# Patient Record
Sex: Male | Born: 1935 | Race: White | Hispanic: No
Health system: Southern US, Community
[De-identification: ages and names within clinical notes are randomized; demographics above are authoritative.]

## PROBLEM LIST (undated history)

## (undated) DIAGNOSIS — F0280 Dementia in other diseases classified elsewhere without behavioral disturbance: Secondary | ICD-10-CM

## (undated) DIAGNOSIS — G309 Alzheimer's disease, unspecified: Secondary | ICD-10-CM

---

## 2006-02-01 ENCOUNTER — Other Ambulatory Visit: Payer: Self-pay

## 2006-02-01 ENCOUNTER — Ambulatory Visit: Payer: Self-pay | Admitting: Specialist

## 2006-02-24 ENCOUNTER — Ambulatory Visit: Payer: Self-pay | Admitting: Otolaryngology

## 2006-03-07 ENCOUNTER — Ambulatory Visit: Payer: Self-pay | Admitting: Radiation Oncology

## 2006-03-20 ENCOUNTER — Ambulatory Visit: Payer: Self-pay | Admitting: Radiation Oncology

## 2006-04-19 ENCOUNTER — Ambulatory Visit: Payer: Self-pay | Admitting: Radiation Oncology

## 2006-05-20 ENCOUNTER — Ambulatory Visit: Payer: Self-pay | Admitting: Radiation Oncology

## 2006-11-30 ENCOUNTER — Ambulatory Visit: Payer: Self-pay | Admitting: Radiation Oncology

## 2007-12-04 ENCOUNTER — Ambulatory Visit: Payer: Self-pay | Admitting: Radiation Oncology

## 2007-12-21 ENCOUNTER — Ambulatory Visit: Payer: Self-pay | Admitting: Radiation Oncology

## 2008-05-05 ENCOUNTER — Other Ambulatory Visit: Payer: Self-pay

## 2008-05-05 ENCOUNTER — Inpatient Hospital Stay: Payer: Self-pay | Admitting: Specialist

## 2008-06-05 ENCOUNTER — Ambulatory Visit: Payer: Self-pay | Admitting: Specialist

## 2013-03-02 ENCOUNTER — Emergency Department: Payer: Self-pay | Admitting: Emergency Medicine

## 2013-03-03 LAB — CBC
HCT: 43.9 % (ref 40.0–52.0)
MCH: 31 pg (ref 26.0–34.0)
MCV: 92 fL (ref 80–100)
RBC: 4.76 10*6/uL (ref 4.40–5.90)
WBC: 12.1 10*3/uL — ABNORMAL HIGH (ref 3.8–10.6)

## 2013-03-03 LAB — BASIC METABOLIC PANEL
Anion Gap: 3 — ABNORMAL LOW (ref 7–16)
BUN: 16 mg/dL (ref 7–18)
Calcium, Total: 8.4 mg/dL — ABNORMAL LOW (ref 8.5–10.1)
Chloride: 107 mmol/L (ref 98–107)
Creatinine: 1.14 mg/dL (ref 0.60–1.30)
EGFR (Non-African Amer.): 59 — ABNORMAL LOW
Osmolality: 283 (ref 275–301)
Potassium: 4 mmol/L (ref 3.5–5.1)
Sodium: 141 mmol/L (ref 136–145)

## 2013-03-03 LAB — PROTIME-INR: Prothrombin Time: 13.4 secs (ref 11.5–14.7)

## 2013-03-09 ENCOUNTER — Emergency Department: Payer: Self-pay | Admitting: Emergency Medicine

## 2013-03-09 LAB — URINALYSIS, COMPLETE
Bilirubin,UR: NEGATIVE
Glucose,UR: NEGATIVE mg/dL (ref 0–75)
Ketone: NEGATIVE
Leukocyte Esterase: NEGATIVE
Nitrite: NEGATIVE
Ph: 5 (ref 4.5–8.0)
Protein: 30
Specific Gravity: 1.025 (ref 1.003–1.030)
WBC UR: 4 /HPF (ref 0–5)

## 2013-03-12 ENCOUNTER — Emergency Department: Payer: Self-pay | Admitting: Emergency Medicine

## 2014-08-31 ENCOUNTER — Emergency Department: Payer: Self-pay | Admitting: Emergency Medicine

## 2015-01-10 ENCOUNTER — Emergency Department: Payer: Self-pay | Admitting: Internal Medicine

## 2015-06-15 ENCOUNTER — Emergency Department: Payer: Medicare PPO

## 2015-06-15 ENCOUNTER — Other Ambulatory Visit: Payer: Self-pay

## 2015-06-15 ENCOUNTER — Emergency Department
Admission: EM | Admit: 2015-06-15 | Discharge: 2015-06-15 | Disposition: A | Discharge status: Home / Self Care | Payer: Medicare PPO | Attending: Student | Admitting: Student

## 2015-06-15 ENCOUNTER — Encounter: Payer: Self-pay | Admitting: Emergency Medicine

## 2015-06-15 DIAGNOSIS — R4182 Altered mental status, unspecified: Secondary | ICD-10-CM | POA: Insufficient documentation

## 2015-06-15 DIAGNOSIS — R1110 Vomiting, unspecified: Secondary | ICD-10-CM | POA: Diagnosis present

## 2015-06-15 DIAGNOSIS — R1111 Vomiting without nausea: Secondary | ICD-10-CM

## 2015-06-15 HISTORY — DX: Dementia in other diseases classified elsewhere, unspecified severity, without behavioral disturbance, psychotic disturbance, mood disturbance, and anxiety: F02.80

## 2015-06-15 HISTORY — DX: Alzheimer's disease, unspecified: G30.9

## 2015-06-15 LAB — URINALYSIS COMPLETE WITH MICROSCOPIC (ARMC ONLY)
Bacteria, UA: NONE SEEN
Bilirubin Urine: NEGATIVE
GLUCOSE, UA: NEGATIVE mg/dL
Hgb urine dipstick: NEGATIVE
Leukocytes, UA: NEGATIVE
NITRITE: NEGATIVE
PROTEIN: 30 mg/dL — AB
Specific Gravity, Urine: 1.02 (ref 1.005–1.030)
pH: 6 (ref 5.0–8.0)

## 2015-06-15 LAB — BASIC METABOLIC PANEL
Anion gap: 10 (ref 5–15)
BUN: 17 mg/dL (ref 6–20)
CALCIUM: 8.7 mg/dL — AB (ref 8.9–10.3)
CHLORIDE: 106 mmol/L (ref 101–111)
CO2: 23 mmol/L (ref 22–32)
Creatinine, Ser: 1.2 mg/dL (ref 0.61–1.24)
GFR calc Af Amer: >60 mL/min (ref >60)
GFR calc non Af Amer: 53 mL/min — ABNORMAL LOW (ref >60)
GLUCOSE: 137 mg/dL — AB (ref 65–99)
Potassium: 4.6 mmol/L (ref 3.5–5.1)
Sodium: 139 mmol/L (ref 135–145)

## 2015-06-15 LAB — HEPATIC FUNCTION PANEL
ALK PHOS: 71 U/L (ref 38–126)
ALT: 19 U/L (ref 17–63)
AST: 35 U/L (ref 15–41)
Albumin: 3.3 g/dL — ABNORMAL LOW (ref 3.5–5.0)
BILIRUBIN INDIRECT: 0.6 mg/dL (ref 0.3–0.9)
Bilirubin, Direct: 0.2 mg/dL (ref 0.1–0.5)
TOTAL PROTEIN: 7 g/dL (ref 6.5–8.1)
Total Bilirubin: 0.8 mg/dL (ref 0.3–1.2)

## 2015-06-15 LAB — CBC WITH DIFFERENTIAL/PLATELET
BASOS ABS: 0.1 10*3/uL (ref 0–0.1)
Basophils Relative: 1 %
EOS PCT: 1 %
Eosinophils Absolute: 0.1 10*3/uL (ref 0–0.7)
HEMATOCRIT: 38 % — AB (ref 40.0–52.0)
Hemoglobin: 12.9 g/dL — ABNORMAL LOW (ref 13.0–18.0)
Lymphocytes Relative: 13 %
Lymphs Abs: 1.4 10*3/uL (ref 1.0–3.6)
MCH: 32.4 pg (ref 26.0–34.0)
MCHC: 33.9 g/dL (ref 32.0–36.0)
MCV: 95.7 fL (ref 80.0–100.0)
MONO ABS: 1 10*3/uL (ref 0.2–1.0)
MONOS PCT: 9 %
Neutro Abs: 8.3 10*3/uL — ABNORMAL HIGH (ref 1.4–6.5)
Neutrophils Relative %: 76 %
Platelets: 205 10*3/uL (ref 150–440)
RBC: 3.97 MIL/uL — ABNORMAL LOW (ref 4.40–5.90)
RDW: 13.4 % (ref 11.5–14.5)
WBC: 10.9 10*3/uL — ABNORMAL HIGH (ref 3.8–10.6)

## 2015-06-15 LAB — LIPASE, BLOOD: Lipase: 48 U/L (ref 22–51)

## 2015-06-15 LAB — TROPONIN I

## 2015-06-15 MED ORDER — SODIUM CHLORIDE 0.9 % IV BOLUS (SEPSIS)
500.0000 mL | Freq: Once | INTRAVENOUS | Status: AC
  Administered 2015-06-15: 500 mL via INTRAVENOUS

## 2015-06-15 NOTE — Discharge Instructions (Signed)
Thomas Mathis should return immediately to the emergency department if he develops recurrent vomiting, if there is blood in his vomit or stool, if his belly/abdomen is distended or if he is complaining of pain in the abdomen, chest, if he has a fever or for any  vital sign abnormality. Otherwise, he needs to be seen by [facility] in the morning.

## 2015-06-15 NOTE — ED Provider Notes (Signed)
[facility] Emergency Department Provider Note  ____________________________________________  Time seen: Approximately 12:12 PM  I have reviewed the triage vital signs and the nursing notes.   HISTORY  Chief Complaint Altered Mental Status and Emesis  Caveat-history of present illness and review of systems limited secondary to the patient's severe dementia. Review of systems obtained from staff at [facility] living facility.  HPI AREN PRYDE is a 81 y.o. male with history of  CVA, severe dementia presents for evaluation of one episode nbnb emesis. According to staff at his living facility, he awoke this morning in his usual state of health and ate breakfast. He then came downstairs sat at the piano and had one episode of  nonbloody nonbilious emesis. They checked his blood pressure and it was 88/69, heart rate 103. They report his usual blood pressure is "100 over something" and because of the drop in blood pressure and increase in heart rate they sent him to the emergency department for further evaluation. Staff reports that generally he is severely demented, combative at baseline. He has had no fevers, no prior vomiting, no diarrhea, no recent falls.   No past medical history on file.  There are no active problems to display for this patient.   No past surgical history on file.  No current outpatient prescriptions on file.  Allergies Review of patient's allergies indicates not on file.  No family history on file.  Social History History  Substance Use Topics  . Smoking status: Not on file  . Smokeless tobacco: Not on file  . Alcohol Use: Not on file    Review of Systems Constitutional: No fever/chills Respiratory: Denies shortness of breath. Gastrointestinal: + vomiting.  No diarrhea.     Caveat-history of present illness and review of systems limited secondary to the patient's severe dementia. Review of systems obtained from staff at  [facility] facility. ____________________________________________   PHYSICAL EXAM:  VITAL SIGNS: ED Triage Vitals  Enc Vitals Group     BP 06/15/15 1113 104/68 mmHg     Pulse Rate 06/15/15 1113 98     Resp 06/15/15 1113 22     Temp 06/15/15 1113 97.7 F (36.5 C)     Temp Source 06/15/15 1113 Oral     SpO2 06/15/15 1113 97 %     Weight 06/15/15 1113 180 lb (81.647 kg)     Height 06/15/15 1113  (1.803 m)     Head Cir --      Peak Flow --      Pain Score --      Pain Loc --      Pain Edu? --      Excl. in GC? --     Constitutional: sleeping but awakens to voice and light touch at which point he becomes somewhat agitated, combative but is redirectable and for the most part follows instructions.  He is intermittently oriented to self, he is oriented to place, he is not oriented to year. Eyes: Conjunctivae are normal. PERRL. EOMI. Head: Atraumatic. Nose: No congestion/rhinnorhea. Mouth/Throat: Mucous membranes are moist.  Oropharynx non-erythematous. Neck: No stridor.   Cardiovascular: Normal rate, regular rhythm. Grossly normal heart sounds.  Good peripheral circulation. Respiratory: Normal respiratory effort.  No retractions. Lungs CTAB. Gastrointestinal: Soft and nontender. No distention. No abdominal bruits. No CVA tenderness. Genitourinary: deferred Musculoskeletal: No lower extremity tenderness nor edema.  No joint effusions. Neurologic:  Normal speech and language. No gross focal neurologic deficits are appreciated. Speech is  normal.  Skin:  Skin is warm, dry and intact. No rash noted. Psychiatric: Mood and affect are normal. Speech and behavior are normal.  ____________________________________________   LABS (all labs ordered are listed, but only abnormal results are displayed)  Labs Reviewed  BASIC METABOLIC PANEL - Abnormal; Notable for the following:    Glucose, Bld 137 (*)    Calcium 8.7 (*)    GFR calc non Af Amer 53 (*)    All other components  within normal limits  CBC WITH DIFFERENTIAL/PLATELET - Abnormal; Notable for the following:    WBC 10.9 (*)    RBC 3.97 (*)    Hemoglobin 12.9 (*)    HCT 38.0 (*)    Neutro Abs 8.3 (*)    All other components within normal limits  URINALYSIS COMPLETEWITH MICROSCOPIC ([facility] ONLY) - Abnormal; Notable for the following:    Color, Urine YELLOW (*)    APPearance CLEAR (*)    Ketones, ur TRACE (*)    Protein, ur 30 (*)    Squamous Epithelial / LPF 0-5 (*)    All other components within normal limits  HEPATIC FUNCTION PANEL - Abnormal; Notable for the following:    Albumin 3.3 (*)    All other components within normal limits  TROPONIN I  LIPASE, BLOOD  CBG MONITORING, ED   ____________________________________________  EKG  ED ECG REPORT I, Gayla Doss, the attending physician, personally viewed and interpreted this ECG.   Date: 06/15/2015  EKG Time: 11:37  Rate: 87  Rhythm: normal sinus rhythm  Axis: left axis  Intervals:right bundle branch block, incomplete  ST&T Change: No acute ST segment elevation  ____________________________________________  RADIOLOGY  CT head IMPRESSION: 1. Suspected old infarcts inferiorly in the frontal lobes and in the left anterior watershed distribution. Extensive chronic ischemic microvascular white matter disease. 2. Increasing (from 01/10/2015) confluent abnormal white matter hypodensity in the right parietal corona radiata, possibly from confluent chronic microvascular white matter disease although a white matter stroke or lesion cannot be completely excluded. Brain MRI could be utilized for further investigation. 3. There is some scattered densities along the right inferior frontal lobe infarct. These appear chronic and may represent dystrophic calcification. 4. Chronic right frontal and right ethmoid sinusitis.  3 view abdominal plain films FINDINGS: There is no evidence of dilated bowel loops or free intraperitoneal air. No  radiopaque calculi or other significant radiographic abnormality is seen. Heart size and mediastinal contours are within normal limits. Both lungs are clear. Degenerative changes in the spine and hips.  IMPRESSION: Negative abdominal radiographs. No acute cardiopulmonary disease.   ____________________________________________   PROCEDURES  Procedure(s) performed: None  Critical Care performed: No  ____________________________________________   INITIAL IMPRESSION / ASSESSMENT AND PLAN / ED COURSE  Pertinent labs & imaging results that were available during my care of the patient were reviewed by me and considered in my medical decision making (see chart for details).  JEFRY LESINSKI is a 81 y.o. male with history of  CVA, severe dementia presents for evaluation of one episode nbnb emesis.on exam, he does not appear to be in any acute distress. Vital signs stable, he is afebrile. He moves all extremities equally. Abdomen is soft, nontender nondistended. No recurrent emesis since arrival to the emergency department. CT head notable for old infarct as well as white matter changes increasing from 01/10/2015. He has documented history of stroke, is at his baseline in terms of mental status, moves all extremities equally, does not require  inpatient admission and I discussed this with staff at his facility and after his making house calls will see him in the morning. Plain films of the abdomen are negative for obstruction or perforation. Labs are generally unremarkable with the exception of  mild anemia. will give small bolus of IV fluids and dc home.  ----------------------------------------- 2:39 PM on 06/15/2015 -----------------------------------------  Blood pressure 118/76 at this time. Patient continues to rest comfortably.no vomiting. DC home. ____________________________________________   FINAL CLINICAL IMPRESSION(S) / ED DIAGNOSES  Final diagnoses:  Non-intractable vomiting  without nausea, vomiting of unspecified type      Gayla Doss, MD 06/15/15 2102

## 2015-06-15 NOTE — ED Notes (Signed)
Pt arrived via EMS from [facility]. Per EMS pt is normally combative but now "acting strangely". Pt vomited once at homecare. Pt alert and oriented to person.

## 2016-04-04 ENCOUNTER — Emergency Department
Admission: EM | Admit: 2016-04-04 | Discharge: 2016-04-04 | Disposition: A | Discharge status: Skilled Nursing Facility | Payer: Medicare Other | Attending: Emergency Medicine | Admitting: Emergency Medicine

## 2016-04-04 ENCOUNTER — Emergency Department: Payer: Medicare Other

## 2016-04-04 DIAGNOSIS — R079 Chest pain, unspecified: Secondary | ICD-10-CM

## 2016-04-04 DIAGNOSIS — J209 Acute bronchitis, unspecified: Secondary | ICD-10-CM | POA: Diagnosis present

## 2016-04-04 DIAGNOSIS — G308 Other Alzheimer's disease: Secondary | ICD-10-CM | POA: Diagnosis not present

## 2016-04-04 DIAGNOSIS — F0280 Dementia in other diseases classified elsewhere without behavioral disturbance: Secondary | ICD-10-CM | POA: Diagnosis not present

## 2016-04-04 DIAGNOSIS — F0390 Unspecified dementia without behavioral disturbance: Secondary | ICD-10-CM

## 2016-04-04 DIAGNOSIS — R0602 Shortness of breath: Secondary | ICD-10-CM | POA: Diagnosis present

## 2016-04-04 LAB — BASIC METABOLIC PANEL
Anion gap: 6 (ref 5–15)
BUN: 19 mg/dL (ref 6–20)
CO2: 29 mmol/L (ref 22–32)
Calcium: 8.7 mg/dL — ABNORMAL LOW (ref 8.9–10.3)
Chloride: 104 mmol/L (ref 101–111)
Creatinine, Ser: 1.16 mg/dL (ref 0.61–1.24)
GFR calc Af Amer: >60 mL/min (ref >60)
GFR calc non Af Amer: 55 mL/min — ABNORMAL LOW (ref >60)
GLUCOSE: 135 mg/dL — AB (ref 65–99)
POTASSIUM: 3.9 mmol/L (ref 3.5–5.1)
Sodium: 139 mmol/L (ref 135–145)

## 2016-04-04 LAB — TROPONIN I: Troponin I: <0.03 ng/mL (ref <0.031)

## 2016-04-04 LAB — CBC
HEMATOCRIT: 39.8 % — AB (ref 40.0–52.0)
HEMOGLOBIN: 13.7 g/dL (ref 13.0–18.0)
MCH: 33.1 pg (ref 26.0–34.0)
MCHC: 34.5 g/dL (ref 32.0–36.0)
MCV: 95.8 fL (ref 80.0–100.0)
Platelets: 186 10*3/uL (ref 150–440)
RBC: 4.15 MIL/uL — ABNORMAL LOW (ref 4.40–5.90)
RDW: 13.4 % (ref 11.5–14.5)
WBC: 7.9 10*3/uL (ref 3.8–10.6)

## 2016-04-04 MED ORDER — CEPHALEXIN 500 MG PO CAPS: 500.0000 mg | ORAL_CAPSULE | Freq: Four times a day (QID) | ORAL | Status: AC

## 2016-04-04 MED ORDER — CEPHALEXIN 500 MG PO CAPS: 500.0000 mg | ORAL_CAPSULE | Freq: Once | ORAL | Status: DC

## 2016-04-04 MED ORDER — ALBUTEROL SULFATE HFA 108 (90 BASE) MCG/ACT IN AERS: 2.0000 | INHALATION_SPRAY | Freq: Four times a day (QID) | RESPIRATORY_TRACT | Status: AC | PRN

## 2016-04-04 NOTE — ED Notes (Signed)
Patient arrived by EMS from group home. Relayed to RN that patient is a ward of the state and has no family to contact. EMS reports that facility states patient was having chest pain and coughing up pink mucous. Patient is unable to answer any questions due to dementia.

## 2016-04-04 NOTE — ED Notes (Signed)
Patient transported to X-ray 

## 2016-04-04 NOTE — ED Provider Notes (Signed)
[facility] Emergency Department Provider Note ___________________________________________  Time seen: Approximately 9:39 AM  I have reviewed the triage vital signs and the nursing notes.   HISTORY  Chief Complaint Chest Pain and Cough  HPI Thomas Mathis is a 82 y.o. male who has history of dementia and was sent over by the nursing care facility for chest pain and cough. On arrival to the ER if he would ask patient if he was having chest pain at times she would say yes and at times even say no. Patient unable to give any other significant history secondary to the severity of his dementia. There was no other known recent illnesses or occurrences per the nursing care facility.   Past Medical History  Diagnosis Date  . Alzheimer's dementia     Patient Active Problem List   Diagnosis Date Noted  . Shortness of breath 04/04/2016  . Acute bronchitis 04/04/2016    No past surgical history on file.  Current Outpatient Rx  Name  Route  Sig  Dispense  Refill  . albuterol (PROVENTIL HFA;VENTOLIN HFA) 108 (90 Base) MCG/ACT inhaler   Inhalation   Inhale 2 puffs into the lungs every 6 (six) hours as needed for wheezing or shortness of breath.   1 Inhaler   2   . cephALEXin (KEFLEX) 500 MG capsule   Oral   Take 1 capsule (500 mg total) by mouth 4 (four) times daily.   40 capsule   0     Allergies Review of patient's allergies indicates not on file.  No family history on file.  Social History Social History  Substance Use Topics  . Smoking status: Unknown If Ever Smoked  . Smokeless tobacco: Not on file  . Alcohol Use: No    Review of Systems Patient unable to give any review of systems secondary to dementia 10-point ROS   ____________________________________________   PHYSICAL EXAM:  VITAL SIGNS: ED Triage Vitals  Enc Vitals Group     BP --      Pulse --      Resp --      Temp --      Temp src --      SpO2 --      Weight --    Height --      Head Cir --      Peak Flow --      Pain Score --      Pain Loc --      Pain Edu? --      Excl. in GC? --     Constitutional: Alert, But demented. Well appearing and in no acute distress. Eyes: Conjunctivae are normal. PERRL. EOMI. Head: Atraumatic. Nose: No congestion/rhinnorhea. Mouth/Throat: Mucous membranes are moist.  Oropharynx non-erythematous. Neck: No stridor.   Cardiovascular: Normal rate, regular rhythm. Grossly normal heart sounds.  Good peripheral circulation. Respiratory: Normal respiratory effort.  No retractions. Lungs CTAB. Gastrointestinal: Soft and nontender. No distention. No abdominal bruits. No CVA tenderness. Musculoskeletal: No lower extremity tenderness nor edema.  No joint effusions. Neurologic:  Obviously with dementia, but tries to answer questions. No gross focal neurologic deficits are appreciated. No gait instability. Skin:  Skin is warm, dry and intact. No rash noted. Psychiatric: Patient is confused and with very aggressive behavior, and hits at the staff..  ____________________________________________   LABS (all labs ordered are listed, but only abnormal results are displayed)  Labs Reviewed  BASIC METABOLIC PANEL - Abnormal; Notable for the following:  Glucose, Bld 135 (*)    Calcium 8.7 (*)    GFR calc non Af Amer 55 (*)    All other components within normal limits  CBC - Abnormal; Notable for the following:    RBC 4.15 (*)    HCT 39.8 (*)    All other components within normal limits  TROPONIN I  TROPONIN I   ____________________________________________  EKG  ED ECG REPORT I, Leona Carry, the attending physician, personally viewed and interpreted this ECG.   Date: 04/04/2016  EKG Time: 1054  Rate: 91  Rhythm: normal sinus rhythm  Axis: Left axis deviation  Intervals:left bundle branch block  ST&T Change: Nonspecific ST and T-wave changes  ____________________________________________  RADIOLOGY  Dg  Chest 2 View  04/04/2016  CLINICAL DATA:  Cough.  Chest pain. EXAM: CHEST  2 VIEW COMPARISON:  06/15/2015. FINDINGS: The cardiac silhouette remains borderline enlarged. A prominent left ventricular contour is again demonstrated. Tortuous aorta. Clear lungs. Diffuse osteopenia. Thoracic spine degenerative changes. IMPRESSION: No acute abnormality. Stable borderline cardiomegaly with probable left ventricular hypertrophy. Electronically Signed   By: Beckie Salts M.D.   On: 04/04/2016 10:31    ____________________________________________   PROCEDURES  Procedure(s) performed: None  Critical Care performed: No  ____________________________________________   INITIAL IMPRESSION / ASSESSMENT AND PLAN / ED COURSE  Pertinent labs & imaging results that were available during my care of the patient were reviewed by me and considered in my medical decision making (see chart for details).  1:20 PM Patient will get routine cardiac enzymes, EKG, and chest x-ray at this time.  1:20 PM Patient's initial cardiac workup was negative. Patient will get a repeat troponin at 3 hour mark to be able to send him home and have her follow-up for workup as an outpatient.  1:20 PM Patient's repeat troponin was negative. Patient never had any complaints in the emergency department. Patient is going to be sent back to the care facility to follow up with cardiology as an outpatient this week. Instructions were given to return patient immediately if condition worsens. ____________________________________________   FINAL CLINICAL IMPRESSION(S) / ED DIAGNOSES  Final diagnoses:  Chest pain, unspecified chest pain type  Dementia, without behavioral disturbance      Leona Carry, MD 04/04/16 1320

## 2016-04-21 ENCOUNTER — Encounter
Admission: RE | Admit: 2016-04-21 | Discharge: 2016-04-21 | Disposition: A | Discharge status: Home / Self Care | Payer: Medicare Other | Source: Ambulatory Visit | Attending: Internal Medicine | Admitting: Internal Medicine

## 2016-05-20 DEATH — deceased

## 2017-01-18 IMAGING — CT CT HEAD W/O CM
1 of 2 series · 13 of 30 positions shown, 17 images · non-contrast
Comparison: 01/10/2015

CLINICAL DATA: Altered mental status.  Combative.  Emesis.

EXAM:
CT HEAD WITHOUT CONTRAST
TECHNIQUE: Contiguous axial images were obtained from the base of the skull
through the vertex without intravenous contrast.

[Series 4: soft tissue recon · axial · 0.42mm/px · z∈[-152,-28]mm · 13 of 31 slices shown, 17 images]
[im 3/31  brain]
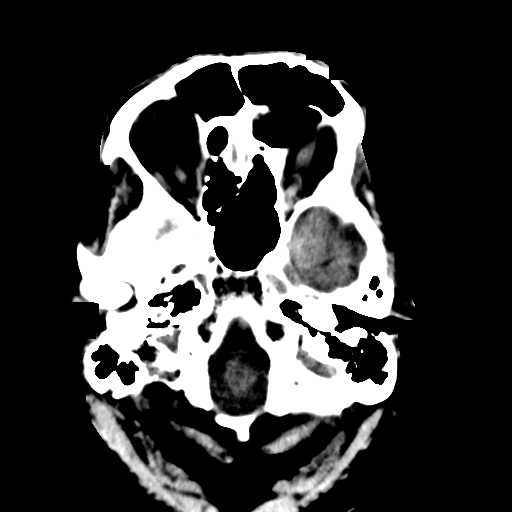
[im 3/31  bone]
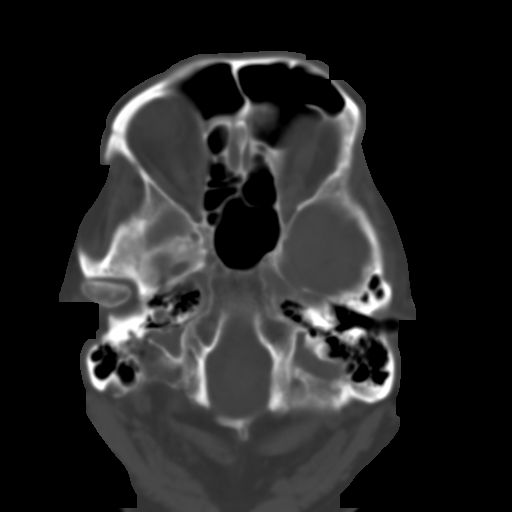
[im 5/31  brain]
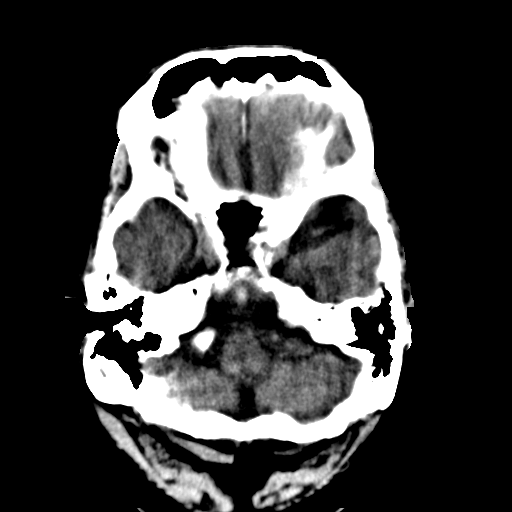
[im 7/31  brain]
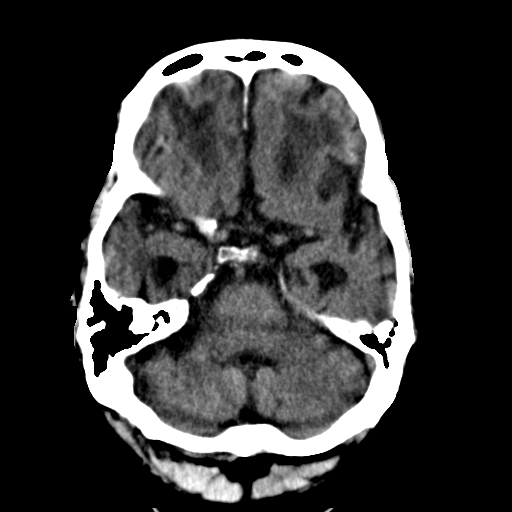
[im 9/31  brain]
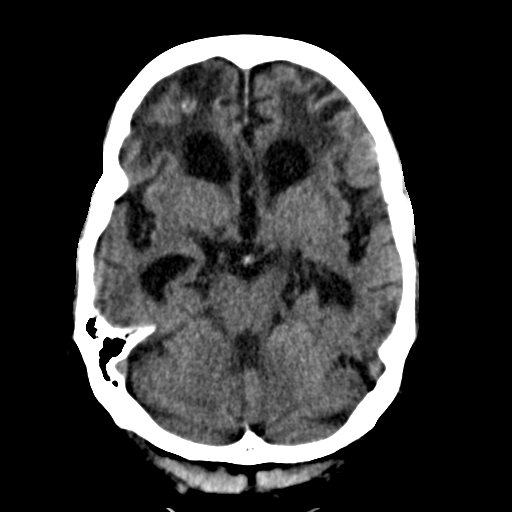
[im 11/31  brain]
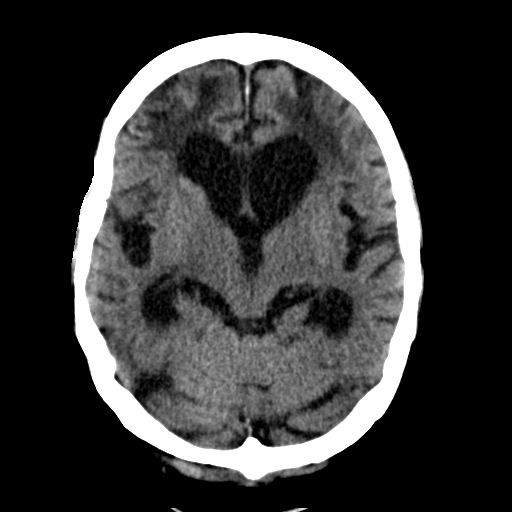
[im 11/31  bone]
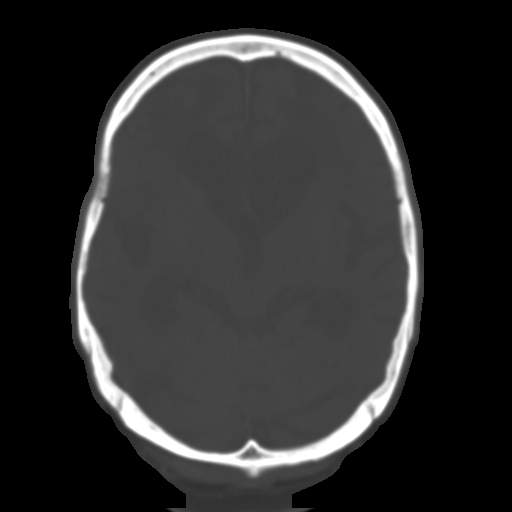
[im 13/31  brain]
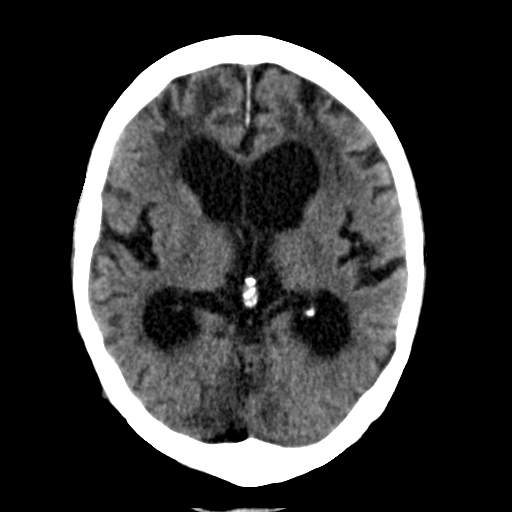
[im 16/31  brain]
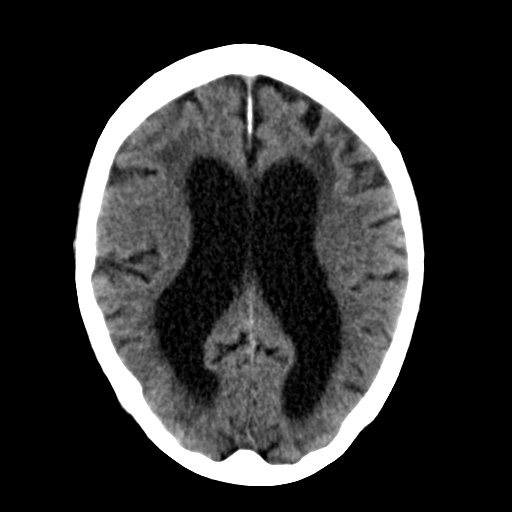
[im 18/31  brain]
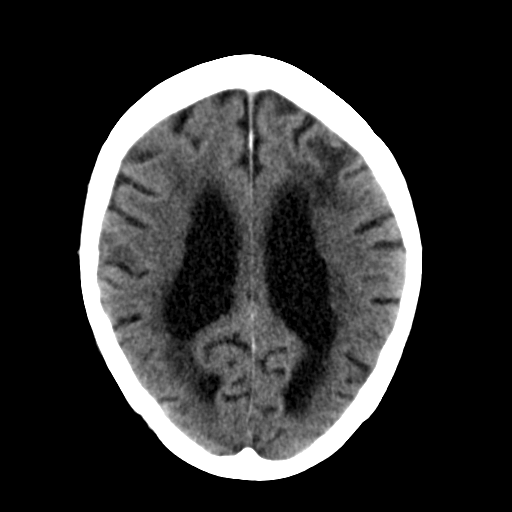
[im 20/31  brain]
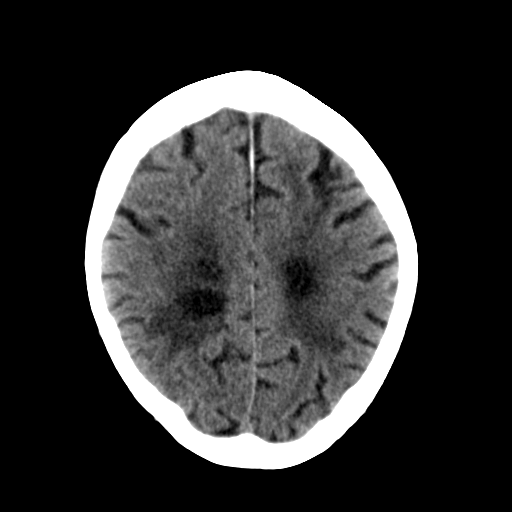
[im 20/31  bone]
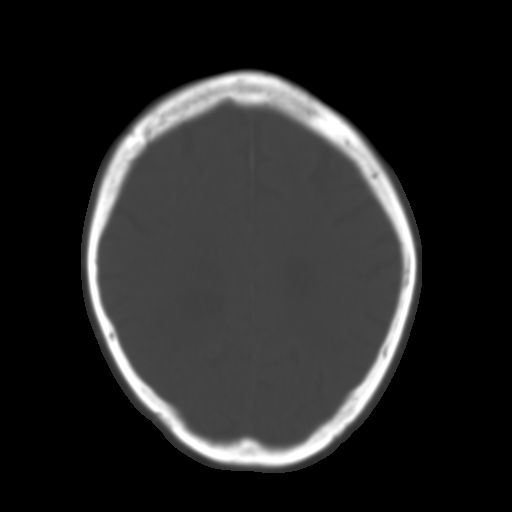
[im 22/31  brain]
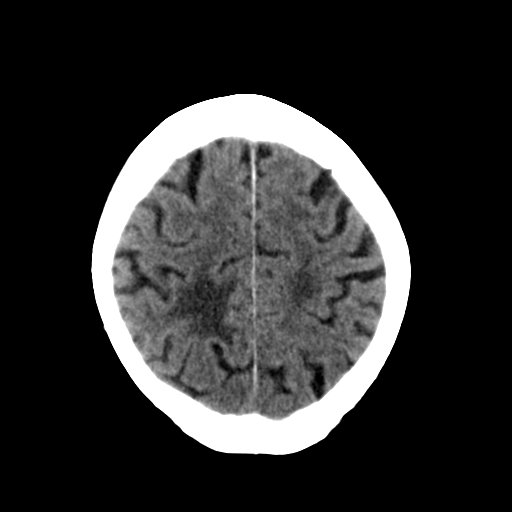
[im 24/31  brain]
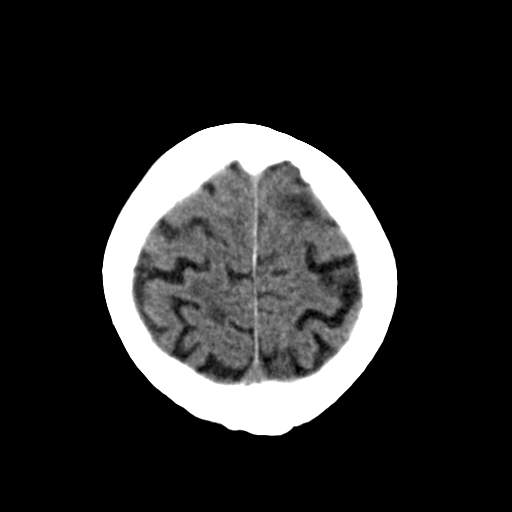
[im 26/31  brain]
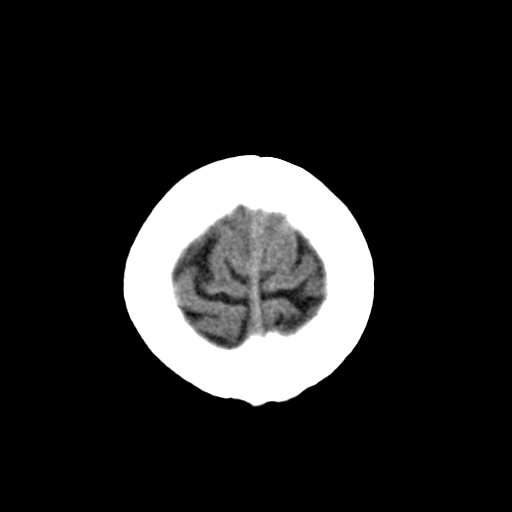
[im 28/31  brain]
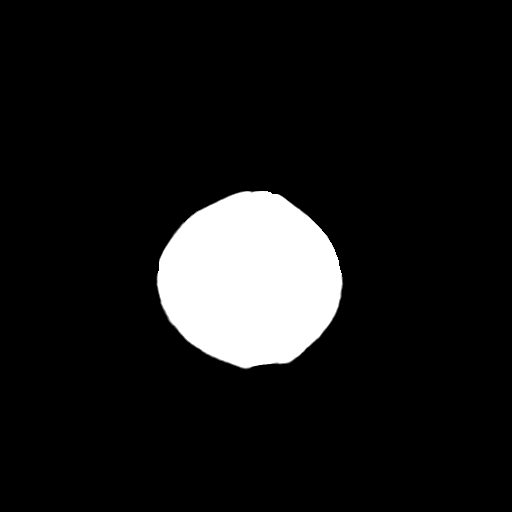
[im 28/31  bone]
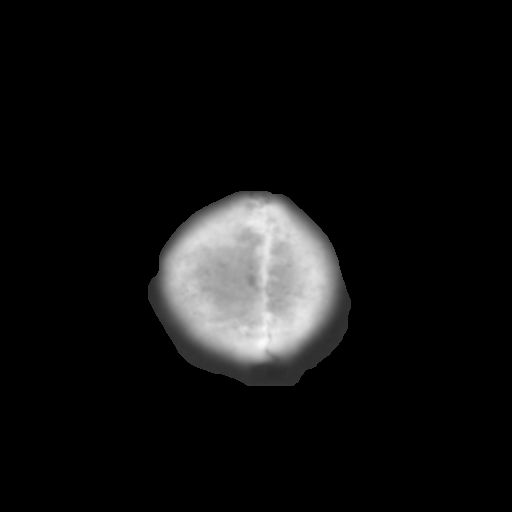

[13 of 30 positions shown; findings below may reference images not displayed]

FINDINGS: Extensive white matter hypodensity bilaterally favoring chronic
ischemic microvascular white matter disease. Suspected remote
infarcts in the inferior frontal lobes as well as the left anterior
watershed region. Indistinct density inferiorly in the right frontal
lobe may represent dystrophic calcification along the
infarct/encephalomalacia, with a mass in this vicinity considered
less likely.

There is a progressive degree of abnormal hypodensity confluent in
the right parietal corona radiata, increased from 01/10/2015.

No acute intracranial hemorrhage.  No mass effect.

Mild chronic right frontal and right ethmoid sinusitis.
IMPRESSION: 1. Suspected old infarcts inferiorly in the frontal lobes and in the
left anterior watershed distribution. Extensive chronic ischemic
microvascular white matter disease.
2. Increasing (from 01/10/2015) confluent abnormal white matter
hypodensity in the right parietal corona radiata, possibly from
confluent chronic microvascular white matter disease although a
white matter stroke or lesion cannot be completely excluded. Brain
MRI could be utilized for further investigation.
3. There is some scattered densities along the right inferior
frontal lobe infarct. These appear chronic and may represent
dystrophic calcification.
4. Chronic right frontal and right ethmoid sinusitis.

## 2017-11-08 IMAGING — CR DG CHEST 2V
1 series · 2 of 2 positions shown · non-contrast
Comparison: 06/15/2015.

CLINICAL DATA: Cough.  Chest pain.

EXAM:
CHEST  2 VIEW

[Series 1: dg chest 2 view · 0.14mm/px · 2 of 2 slices shown]
[im 1/2]
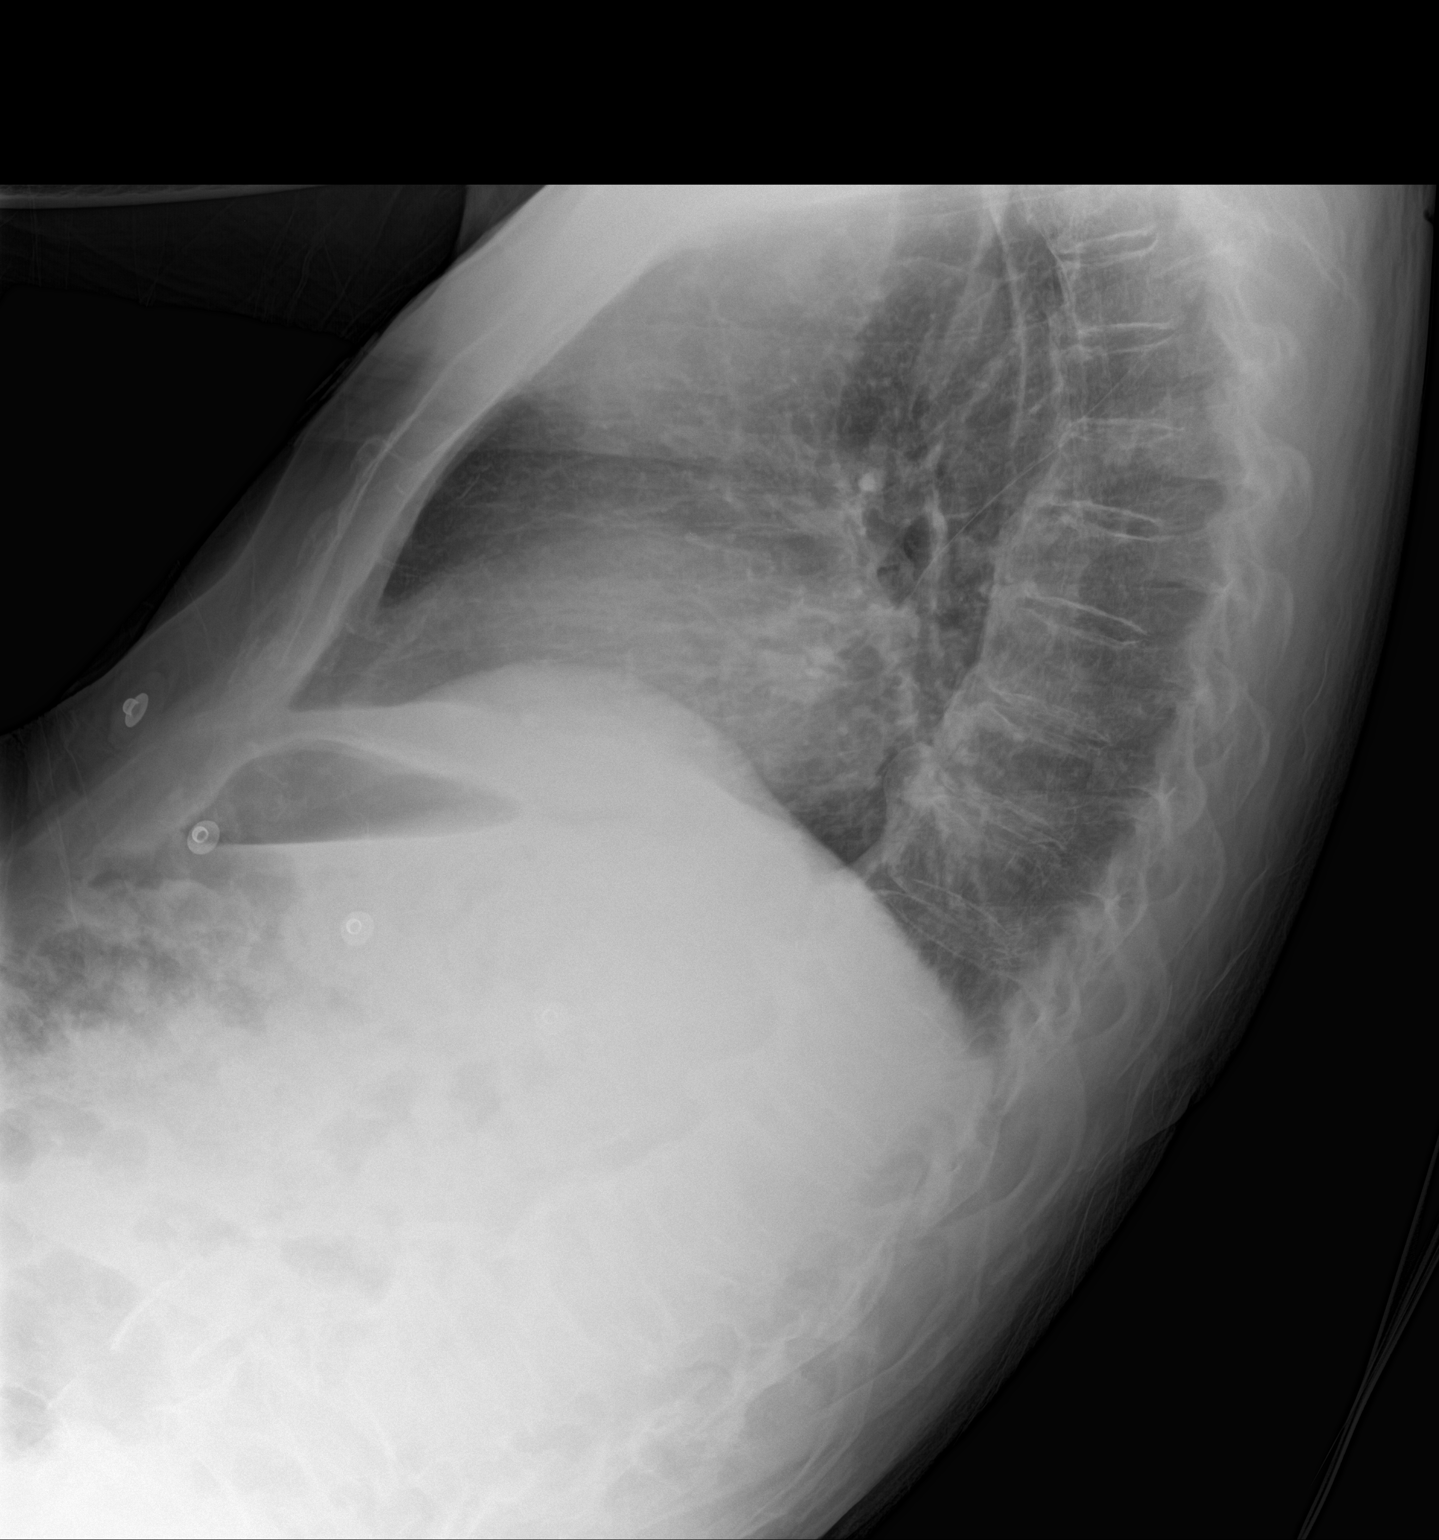
[im 2/2]
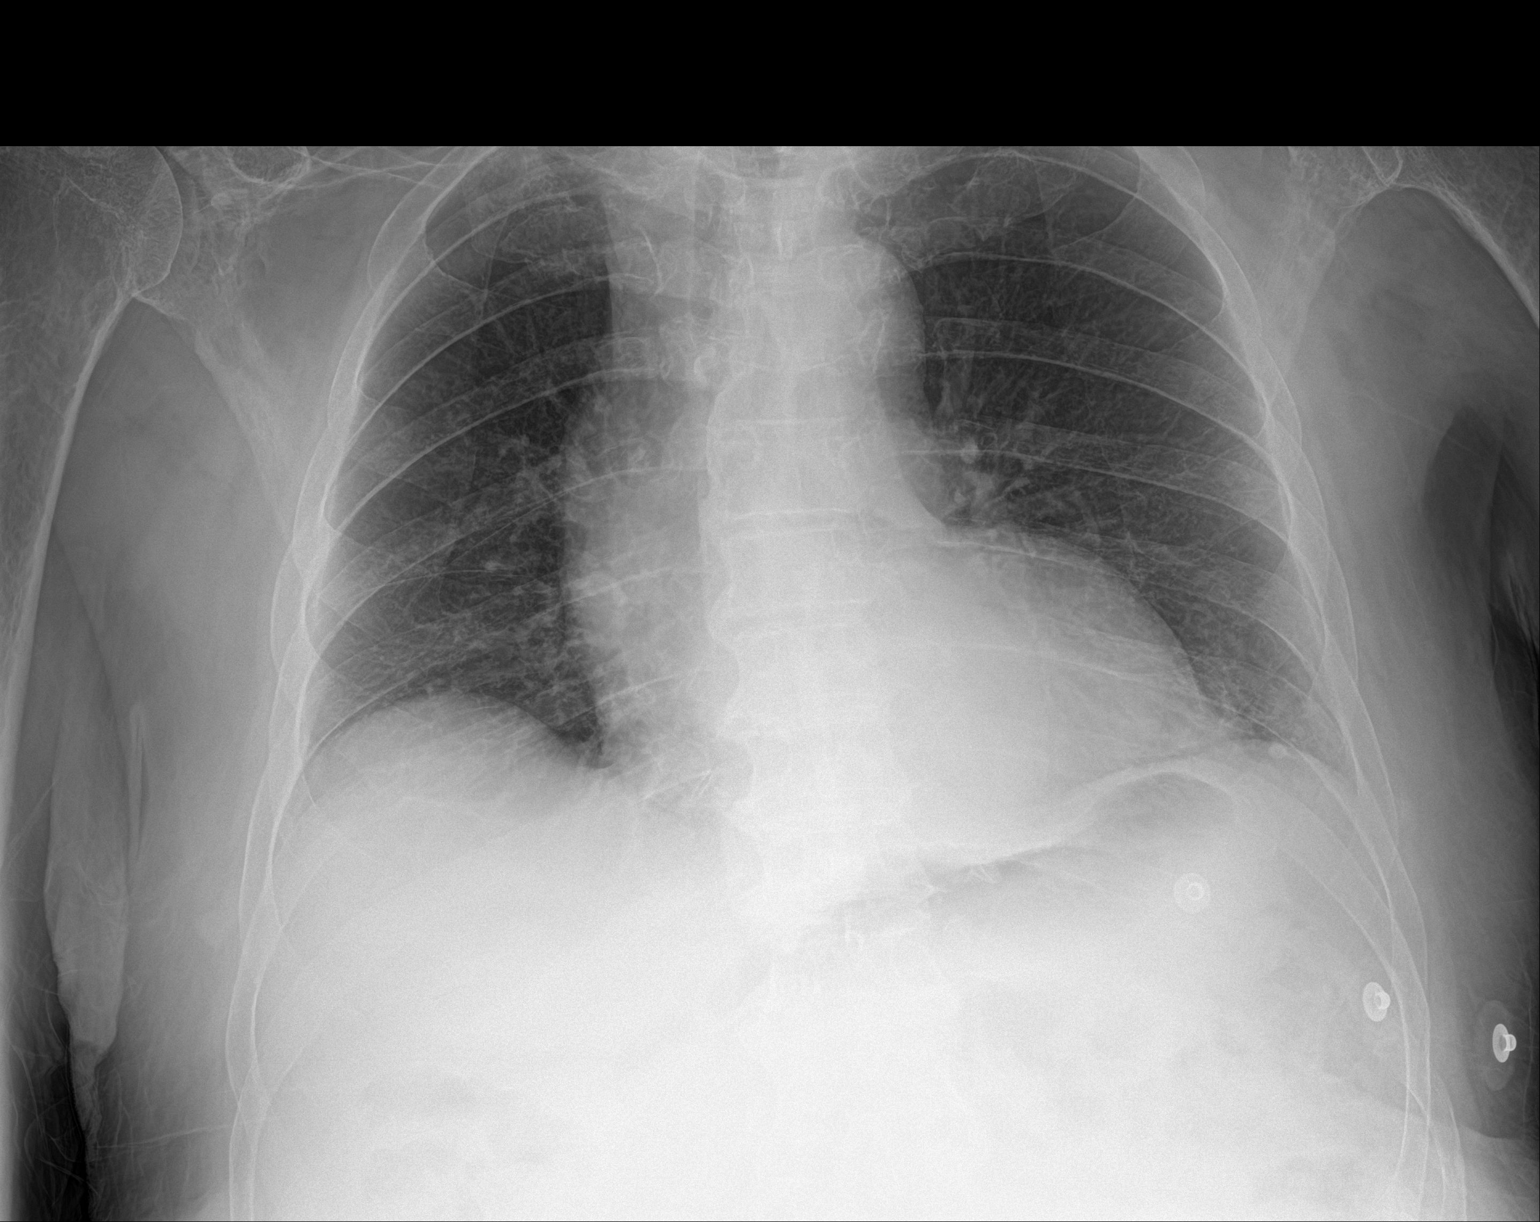

[2 of 2 positions shown; findings below may reference images not displayed]

FINDINGS: The cardiac silhouette remains borderline enlarged. A prominent left
ventricular contour is again demonstrated. Tortuous aorta. Clear
lungs. Diffuse osteopenia. Thoracic spine degenerative changes.
IMPRESSION: No acute abnormality. Stable borderline cardiomegaly with probable
left ventricular hypertrophy.
